# Patient Record
Sex: Female | Born: 1995 | Race: Black or African American | Hispanic: No | Marital: Single | Smoking: Never smoker
Health system: Southern US, Community
[De-identification: ages and names within clinical notes are randomized; demographics above are authoritative.]

## PROBLEM LIST (undated history)

## (undated) DIAGNOSIS — N946 Dysmenorrhea, unspecified: Secondary | ICD-10-CM

## (undated) DIAGNOSIS — Z3189 Encounter for other procreative management: Secondary | ICD-10-CM

## (undated) DIAGNOSIS — J45909 Unspecified asthma, uncomplicated: Secondary | ICD-10-CM

## (undated) DIAGNOSIS — J189 Pneumonia, unspecified organism: Secondary | ICD-10-CM

---

## 2017-06-23 ENCOUNTER — Emergency Department (HOSPITAL_COMMUNITY)
Admission: EM | Admit: 2017-06-23 | Discharge: 2017-06-23 | Disposition: A | Discharge status: Home / Self Care | Payer: Medicaid - Out of State | Attending: Physician Assistant | Admitting: Physician Assistant

## 2017-06-23 ENCOUNTER — Emergency Department (HOSPITAL_COMMUNITY): Payer: Medicaid - Out of State

## 2017-06-23 ENCOUNTER — Encounter (HOSPITAL_COMMUNITY): Payer: Self-pay | Admitting: Emergency Medicine

## 2017-06-23 DIAGNOSIS — Z77120 Contact with and (suspected) exposure to mold (toxic): Secondary | ICD-10-CM

## 2017-06-23 DIAGNOSIS — J029 Acute pharyngitis, unspecified: Secondary | ICD-10-CM | POA: Diagnosis present

## 2017-06-23 DIAGNOSIS — R05 Cough: Secondary | ICD-10-CM | POA: Diagnosis not present

## 2017-06-23 DIAGNOSIS — J069 Acute upper respiratory infection, unspecified: Secondary | ICD-10-CM | POA: Diagnosis not present

## 2017-06-23 DIAGNOSIS — B9789 Other viral agents as the cause of diseases classified elsewhere: Secondary | ICD-10-CM

## 2017-06-23 HISTORY — DX: Pneumonia, unspecified organism: J18.9

## 2017-06-23 LAB — RAPID STREP SCREEN (MED CTR MEBANE ONLY): STREPTOCOCCUS, GROUP A SCREEN (DIRECT): NEGATIVE

## 2017-06-23 MED ORDER — CETIRIZINE-PSEUDOEPHEDRINE ER 5-120 MG PO TB12: 1.0000 | ORAL_TABLET | Freq: Every day | ORAL | 0 refills | Status: AC

## 2017-06-23 MED ORDER — IBUPROFEN 600 MG PO TABS: 600.0000 mg | ORAL_TABLET | Freq: Four times a day (QID) | ORAL | 0 refills | Status: AC | PRN

## 2017-06-23 NOTE — ED Notes (Signed)
Pt taken to Xray.

## 2017-06-23 NOTE — Discharge Instructions (Addendum)
Please read and follow all provided instructions.  Your diagnoses today include:  1. Viral pharyngitis     Tests performed today include: Vital signs. See below for your results today.   Medications prescribed:  Take as prescribed   Home care instructions:  Follow any educational materials contained in this packet.  Follow-up instructions: Please follow-up with your primary care provider for further evaluation of symptoms and treatment   Return instructions:  Please return to the Emergency Department if you do not get better, if you get worse, or new symptoms OR  - Fever (temperature greater than 101.68F)  - Bleeding that does not stop with holding pressure to the area    -Severe pain (please note that you may be more sore the day after your accident)  - Chest Pain  - Difficulty breathing  - Severe nausea or vomiting  - Inability to tolerate food and liquids  - Passing out  - Skin becoming red around your wounds  - Change in mental status (confusion or lethargy)  - New numbness or weakness    Please return if you have any other emergent concerns.  Additional Information:  Your vital signs today were: BP 110/72 (BP Location: Right Arm)    Pulse 96    Temp 98.4 F (36.9 C) (Oral)    Resp 16    Ht  (1.803 m)    Wt 74.8 kg (165 lb)    LMP 06/03/2017    SpO2 100%    BMI 23.01 kg/m  If your blood pressure (BP) was elevated above 135/85 this visit, please have this repeated by your doctor within one month. ---------------

## 2017-06-23 NOTE — ED Provider Notes (Signed)
[facility]-EMERGENCY DEPT Provider Note   CSN: [id number] Arrival date & time: 06/23/17  1625     History   Chief Complaint Chief Complaint  Patient presents with  . Sore Throat    HPI Maria Rubio is a 21 y.o. female.  HPI  21 y.o. female  presents to the Emergency Department today due to sore throat and nasal congestion x several days. 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 States residents are moving out because of the mold. Pt worried because her symptoms feels like pneumonia in the past. No N/V/D. Notes shortness of breath intermittently. No CP. Notes rhinorrhea, congestion of sinuses, sore throat. No fevers. No meds PTA. No other symptoms noted.     Past Medical History:  Diagnosis Date  . Pneumonia     There are no active problems to display for this patient.   History reviewed. No pertinent surgical history.  OB History    No data available       Home Medications    Prior to Admission medications   Not on File    Family History No family history on file.  Social History Social History  Substance Use Topics  . Smoking status: Never Smoker  . Smokeless tobacco: Never Used  . Alcohol use No     Allergies   Dilaudid [hydromorphone hcl] and Fentanyl   Review of Systems Review of Systems  Constitutional: Negative for fever.  HENT: Positive for congestion and sore throat. Negative for trouble swallowing and voice change.   Respiratory: Negative for cough and shortness of breath.   Cardiovascular: Negative for chest pain.  Gastrointestinal: Negative for nausea and vomiting.     Physical Exam Updated Vital Signs BP 110/72 (BP Location: Right Arm)   Pulse 96   Temp 98.4 F (36.9 C) (Oral)   Resp 16   Ht  (1.803 m)   Wt 74.8 kg (165 lb)   LMP 06/03/2017   SpO2 100%   BMI 23.01 kg/m   Physical Exam  Constitutional: She is oriented to person, place, and time. She appears well-developed and  well-nourished. No distress.  HENT:  Head: Normocephalic and atraumatic.  Right Ear: Tympanic membrane, external ear and ear canal normal.  Left Ear: Tympanic membrane, external ear and ear canal normal.  Nose: Nose normal.  Mouth/Throat: Uvula is midline, oropharynx is clear and moist and mucous membranes are normal. No trismus in the jaw. No oropharyngeal exudate, posterior oropharyngeal erythema or tonsillar abscesses.  Eyes: Pupils are equal, round, and reactive to light. EOM are normal.  Neck: Normal range of motion. Neck supple. No tracheal deviation present.  Cardiovascular: Normal rate, regular rhythm, S1 normal, S2 normal, normal heart sounds, intact distal pulses and normal pulses.   Pulmonary/Chest: Effort normal and breath sounds normal. No respiratory distress. She has no decreased breath sounds. She has no wheezes. She has no rhonchi. She has no rales.  Abdominal: Normal appearance and bowel sounds are normal. There is no tenderness.  Musculoskeletal: Normal range of motion.  Neurological: She is alert and oriented to person, place, and time.  Skin: Skin is warm and dry.  Psychiatric: She has a normal mood and affect. Her speech is normal and behavior is normal. Thought content normal.     ED Treatments / Results  Labs (all labs ordered are listed, but only abnormal results are displayed) Labs Reviewed  RAPID STREP SCREEN (NOT AT [facility])  CULTURE,  GROUP [facility]    EKG  EKG Interpretation None       Radiology Dg Chest 2 View  Result Date: 06/23/2017 CLINICAL DATA:  Sore throat and nasal congestion. EXAM: CHEST  2 VIEW COMPARISON:  None. FINDINGS: The heart size is normal. The lungs are clear. The visualized soft tissues are unremarkable. Dextroconvex scoliosis of the thoracic spine measures 24 degrees from the superior endplate of T7 through the superior endplate of L1. IMPRESSION: 1. No acute cardiopulmonary disease. 2. Scoliosis. Electronically Signed   By:  Marin Roberts M.D.   On: 06/23/2017 18:40    Procedures Procedures (including critical care time)  Medications Ordered in ED Medications - No data to display   Initial Impression / Assessment and Plan / ED Course  I have reviewed the triage vital signs and the nursing notes.  Pertinent labs & imaging results that were available during my care of the patient were reviewed by me and considered in my medical decision making (see chart for details).  Final Clinical Impressions(s) / ED Diagnoses  {I have reviewed and evaluated the relevant laboratory values.   {I have reviewed the relevant previous healthcare records.  {I obtained HPI from historian.   ED Course:  Assessment: Pt is a 21 y.o. female presents to the Emergency Department today due to sore throat and nasal congestion x several days. 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 States residents are moving out because of the mold. Pt worried because her symptoms feels like pneumonia in the past. No N/V/D. Notes shortness of breath intermittently. No CP. Notes rhinorrhea, congestion of sinuses, sore throat. No fevers. No meds PTA. On exam, pt in NAD. Nontoxic/nonseptic appearing. VSS. Afebrile. Lungs CTA. Heart RRR. Abdomen nontender soft. Strep negative. CXR unremarkable. Plan is to DC home with follow up to PCP. Encouraged leaving dorm with mold.. At time of discharge, Patient is in no acute distress. Vital Signs are stable. Patient is able to ambulate. Patient able to tolerate PO.   Disposition/Plan:  DC Home Additional Verbal discharge instructions given and discussed with patient.  Pt Instructed to f/u with PCP in the next week for evaluation and treatment of symptoms. Return precautions given Pt acknowledges and agrees with plan  Supervising Physician Mackuen, Courteney Lyn, *  Final diagnoses:  Viral URI with cough    New Prescriptions New Prescriptions   No medications on  file     Audry Pili, Cordelia Poche 06/23/17 1901    Abelino Derrick, MD 06/24/17 1458

## 2017-06-23 NOTE — ED Triage Notes (Signed)
Pt c/o sore throat and nasal congestion. 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

## 2017-06-23 NOTE — ED Notes (Signed)
Pt returned from X-ray.  

## 2017-06-26 LAB — CULTURE, GROUP A STREP (THRC)

## 2018-11-27 ENCOUNTER — Emergency Department (HOSPITAL_COMMUNITY)
Admission: EM | Admit: 2018-11-27 | Discharge: 2018-11-27 | Disposition: A | Discharge status: Home / Self Care | Payer: BLUE CROSS/BLUE SHIELD | Attending: Emergency Medicine | Admitting: Emergency Medicine

## 2018-11-27 ENCOUNTER — Encounter (HOSPITAL_COMMUNITY): Payer: Self-pay

## 2018-11-27 ENCOUNTER — Other Ambulatory Visit: Payer: Self-pay

## 2018-11-27 DIAGNOSIS — Z5321 Procedure and treatment not carried out due to patient leaving prior to being seen by health care provider: Secondary | ICD-10-CM | POA: Insufficient documentation

## 2018-11-27 DIAGNOSIS — R079 Chest pain, unspecified: Secondary | ICD-10-CM | POA: Insufficient documentation

## 2018-11-27 HISTORY — DX: Unspecified asthma, uncomplicated: J45.909

## 2018-11-27 LAB — BASIC METABOLIC PANEL
ANION GAP: 9 (ref 5–15)
BUN: 10 mg/dL (ref 6–20)
CALCIUM: 9.2 mg/dL (ref 8.9–10.3)
CO2: 24 mmol/L (ref 22–32)
Chloride: 104 mmol/L (ref 98–111)
Creatinine, Ser: 0.95 mg/dL (ref 0.44–1.00)
GFR calc non Af Amer: >60 mL/min (ref >60)
Glucose, Bld: 85 mg/dL (ref 70–99)
Potassium: 3.9 mmol/L (ref 3.5–5.1)
SODIUM: 137 mmol/L (ref 135–145)

## 2018-11-27 LAB — I-STAT TROPONIN, ED: Troponin i, poc: 0 ng/mL (ref 0.00–0.08)

## 2018-11-27 LAB — CBC
HEMATOCRIT: 40.6 % (ref 36.0–46.0)
Hemoglobin: 12.8 g/dL (ref 12.0–15.0)
MCH: 27.7 pg (ref 26.0–34.0)
MCHC: 31.5 g/dL (ref 30.0–36.0)
MCV: 87.9 fL (ref 80.0–100.0)
NRBC: 0 % (ref 0.0–0.2)
PLATELETS: 302 10*3/uL (ref 150–400)
RBC: 4.62 MIL/uL (ref 3.87–5.11)
RDW: 13.1 % (ref 11.5–15.5)
WBC: 5.9 10*3/uL (ref 4.0–10.5)

## 2018-11-27 LAB — I-STAT BETA HCG BLOOD, ED (MC, WL, AP ONLY)

## 2018-11-27 MED ORDER — SODIUM CHLORIDE 0.9% FLUSH: 3.0000 mL | Freq: Once | INTRAVENOUS | Status: DC

## 2018-11-27 NOTE — ED Notes (Signed)
Pt. At desk wondering her wait time and asking if she will get a Coronavirus test. This tech explained procedures to pt and made no promises, explained that she would need to be seen by a doctor before any exam and test would be done. Pt. Stating she doesn't want to wait  Another 3 hours for nothing to be done so she would be leaving. Pulling pt. OTF.

## 2018-11-27 NOTE — ED Triage Notes (Signed)
Pt reports recent travel from [place] and now having chest pain, sore throat, intermittent cough and fever as high as 99.4. pt was seen at her student clinis earlier today and they said she was negative for the flu and strept throat but would like to be checked for the coronavirus. Pt a.o, nad at this time.

## 2019-05-13 IMAGING — DX DG CHEST 2V
2 series · 2 of 2 positions shown · non-contrast
Comparison: None.

CLINICAL DATA: Sore throat and nasal congestion.

EXAM:
CHEST  2 VIEW

[chest pa]
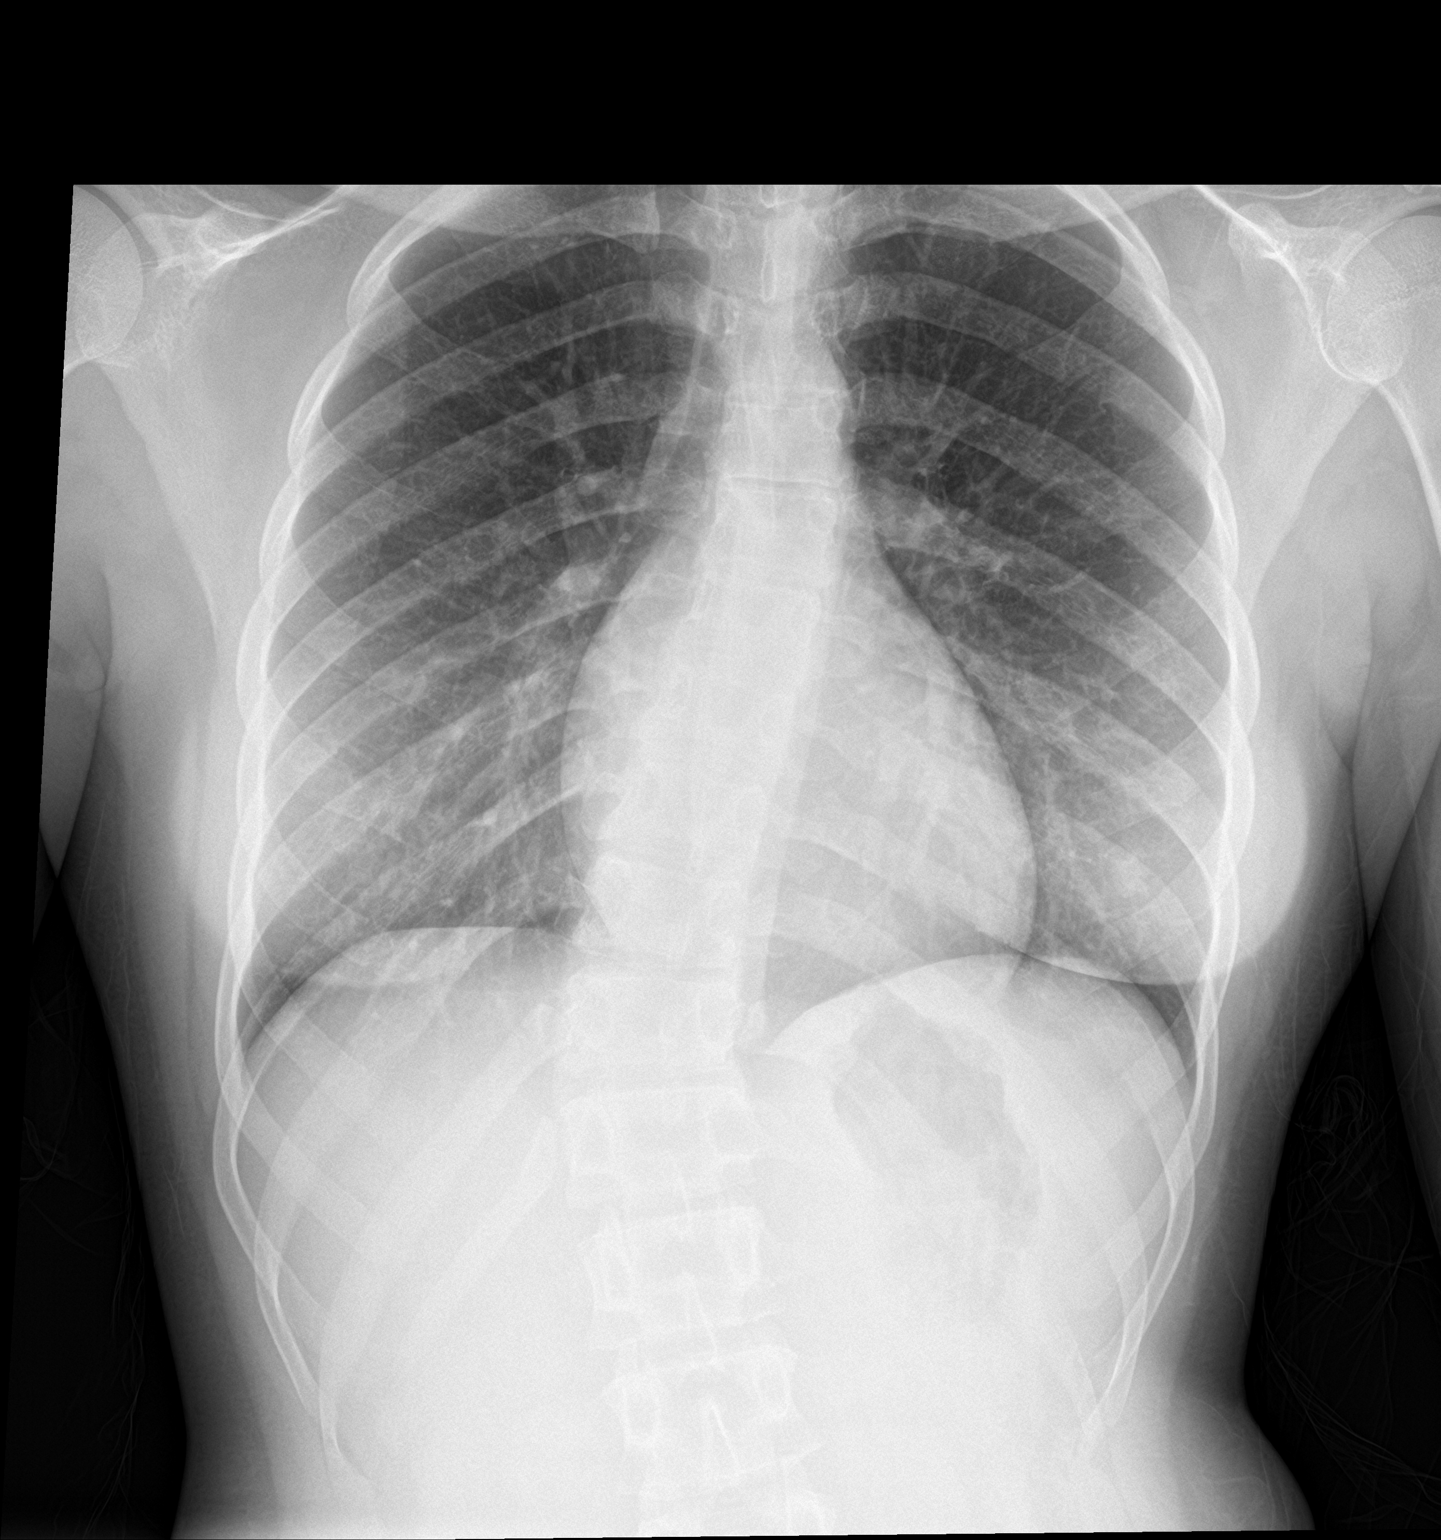

[chest lat]
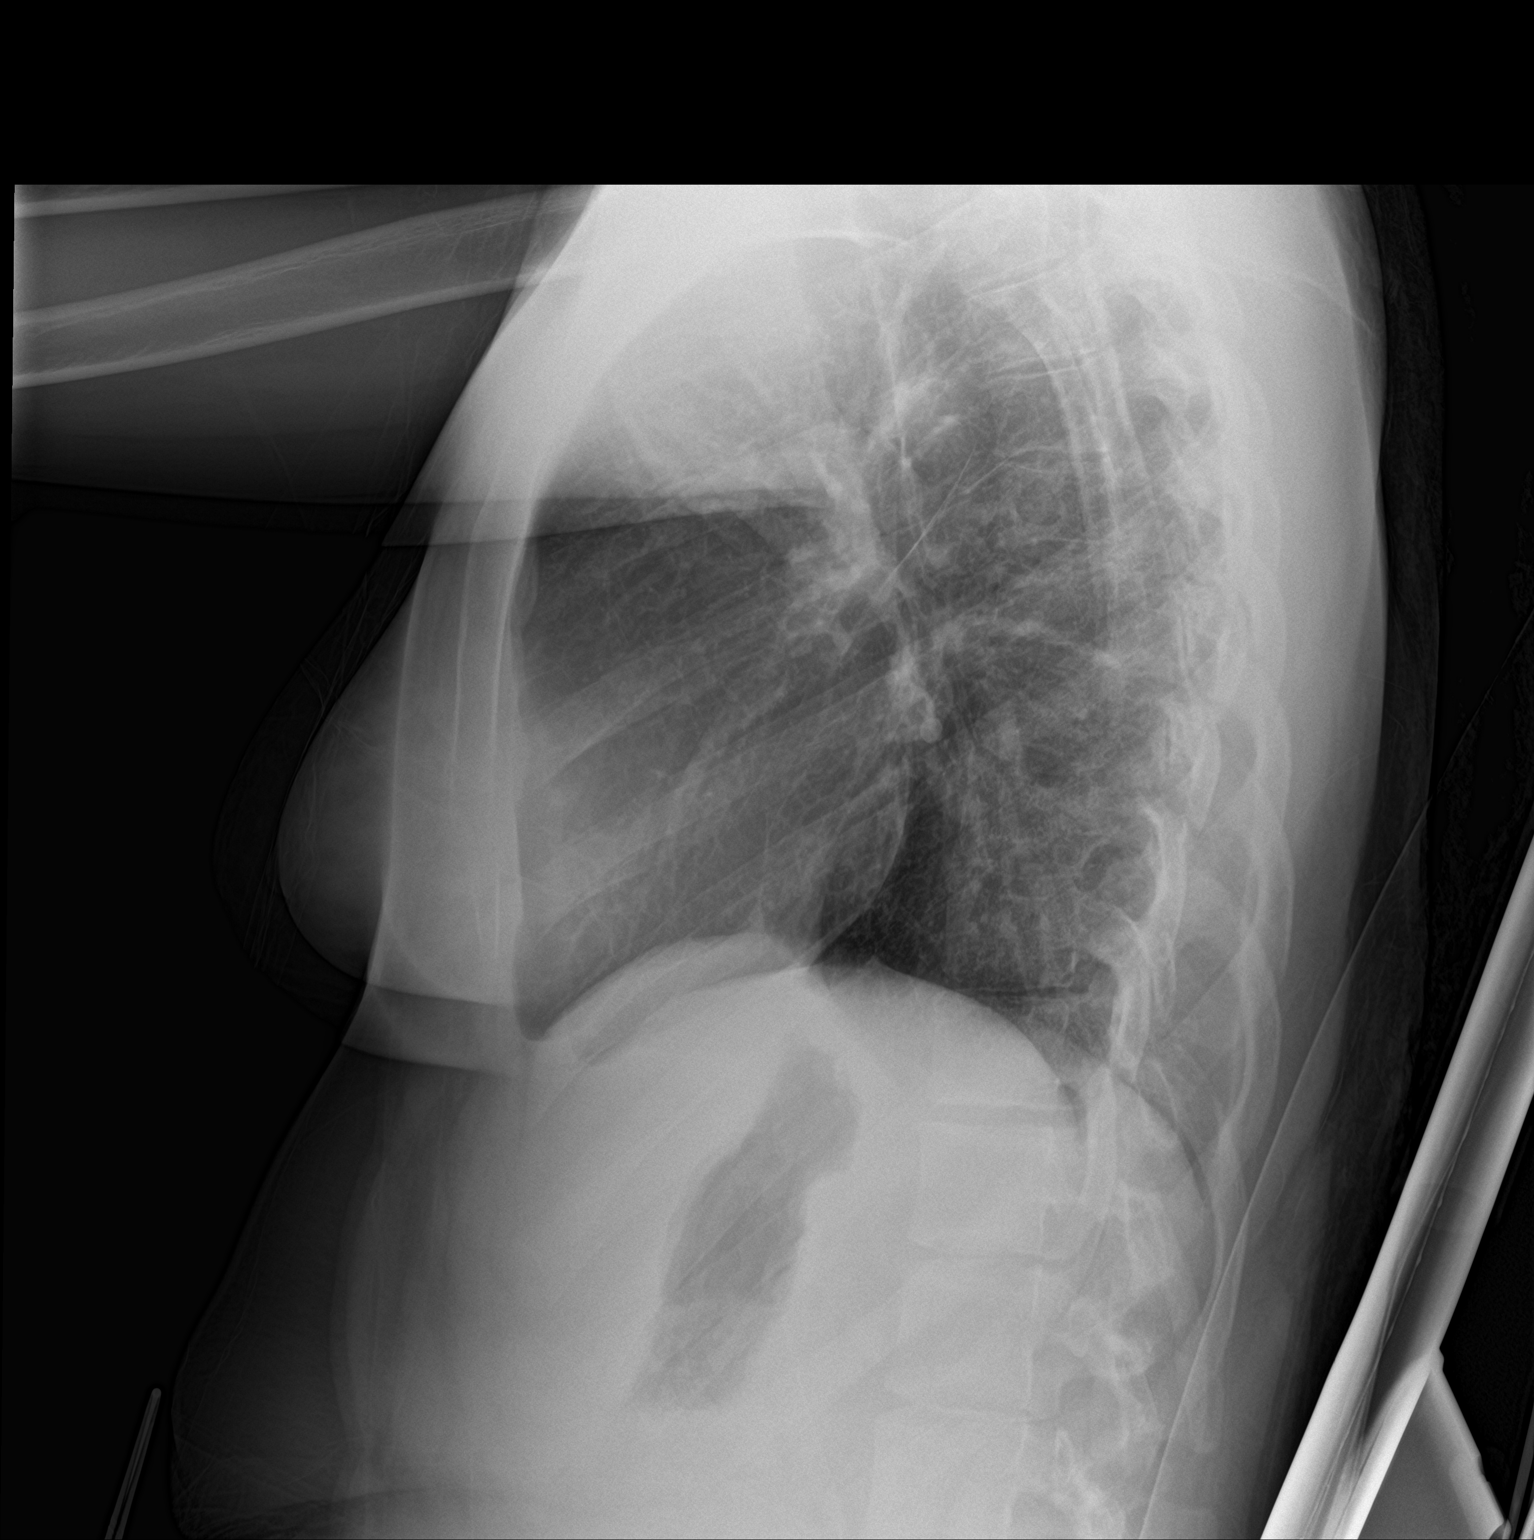

[2 of 2 positions shown; findings below may reference images not displayed]

FINDINGS: The heart size is normal. The lungs are clear. The visualized soft
tissues are unremarkable. Dextroconvex scoliosis of the thoracic
spine measures 24 degrees from the superior endplate of T7 through
the superior endplate of L1.
IMPRESSION: 1. No acute cardiopulmonary disease.
2. Scoliosis.

## 2019-08-12 ENCOUNTER — Other Ambulatory Visit: Payer: Self-pay

## 2019-08-12 ENCOUNTER — Emergency Department (HOSPITAL_COMMUNITY)
Admission: EM | Admit: 2019-08-12 | Discharge: 2019-08-13 | Disposition: A | Discharge status: Home / Self Care | Payer: BC Managed Care – PPO | Attending: Emergency Medicine | Admitting: Emergency Medicine

## 2019-08-12 ENCOUNTER — Encounter (HOSPITAL_COMMUNITY): Payer: Self-pay | Admitting: Emergency Medicine

## 2019-08-12 DIAGNOSIS — R509 Fever, unspecified: Secondary | ICD-10-CM

## 2019-08-12 DIAGNOSIS — J038 Acute tonsillitis due to other specified organisms: Secondary | ICD-10-CM | POA: Diagnosis not present

## 2019-08-12 DIAGNOSIS — B9789 Other viral agents as the cause of diseases classified elsewhere: Secondary | ICD-10-CM | POA: Insufficient documentation

## 2019-08-12 DIAGNOSIS — Z20828 Contact with and (suspected) exposure to other viral communicable diseases: Secondary | ICD-10-CM | POA: Diagnosis not present

## 2019-08-12 DIAGNOSIS — J029 Acute pharyngitis, unspecified: Secondary | ICD-10-CM | POA: Diagnosis present

## 2019-08-12 LAB — GROUP A STREP BY PCR: Group A Strep by PCR: NOT DETECTED

## 2019-08-12 NOTE — ED Triage Notes (Signed)
Pt c/o sore throat that started today and a fever. States she took 600mg  ibuprofen PTA.

## 2019-08-13 MED ORDER — ALUM & MAG HYDROXIDE-SIMETH 200-200-20 MG/5ML PO SUSP
30.0000 mL | Freq: Once | ORAL | Status: AC
  Administered 2019-08-13: 30 mL via ORAL
  Filled 2019-08-13: qty 30

## 2019-08-13 MED ORDER — DEXAMETHASONE SODIUM PHOSPHATE 10 MG/ML IJ SOLN
10.0000 mg | Freq: Once | INTRAMUSCULAR | Status: AC
  Administered 2019-08-13: 10 mg via INTRAMUSCULAR
  Filled 2019-08-13: qty 1

## 2019-08-13 MED ORDER — LIDOCAINE VISCOUS HCL 2 % MT SOLN
15.0000 mL | Freq: Once | OROMUCOSAL | Status: AC
  Administered 2019-08-13: 15 mL via ORAL
  Filled 2019-08-13: qty 15

## 2019-08-13 MED ORDER — MAGIC MOUTHWASH W/LIDOCAINE: 5.0000 mL | Freq: Four times a day (QID) | ORAL | 0 refills | Status: DC | PRN

## 2019-08-13 MED ORDER — MAGIC MOUTHWASH W/LIDOCAINE: 5.0000 mL | Freq: Four times a day (QID) | ORAL | 0 refills | Status: AC | PRN

## 2019-08-13 MED ORDER — IBUPROFEN 800 MG PO TABS
800.0000 mg | ORAL_TABLET | Freq: Once | ORAL | Status: AC
  Administered 2019-08-13: 800 mg via ORAL
  Filled 2019-08-13: qty 1

## 2019-08-13 NOTE — ED Provider Notes (Signed)
[facility] EMERGENCY DEPARTMENT Provider Note   CSN: [id number] Arrival date & time: 08/12/19  2254     History   Chief Complaint Chief Complaint  Patient presents with  . Sore Throat    HPI Maria Rubio is a 23 y.o. female.     23 year old female presents to the emergency department for evaluation of sore throat which began on Monday, August 12, 2019.  Her sore throat has been constant and unchanged.  Not relieved with 600 mg ibuprofen.  This was last taken at 1500.  She has odynophagia, but no inability to swallow, drooling.  Noted to be febrile in triage.  Denies congestion, shortness of breath, cough, vomiting.  No known sick contacts.  Did have a negative outpatient Covid test 1 week ago.  The history is provided by the patient. No language interpreter was used.  Sore Throat    Past Medical History:  Diagnosis Date  . Asthma   . Pneumonia     There are no active problems to display for this patient.   History reviewed. No pertinent surgical history.   OB History   No obstetric history on file.      Home Medications    Prior to Admission medications   Medication Sig Start Date End Date Taking? Authorizing Provider  cetirizine-pseudoephedrine (ZYRTEC-D) 5-120 MG tablet Take 1 tablet by mouth daily. 06/23/17   Audry Pili, PA-C  ibuprofen (ADVIL,MOTRIN) 600 MG tablet Take 1 tablet (600 mg total) by mouth every 6 (six) hours as needed. 06/23/17   Audry Pili, PA-C    Family History No family history on file.  Social History Social History   Tobacco Use  . Smoking status: Never Smoker  . Smokeless tobacco: Never Used  Substance Use Topics  . Alcohol use: No  . Drug use: No     Allergies   Dilaudid [hydromorphone hcl], Fentanyl, Aspirin, and Tylenol [acetaminophen]   Review of Systems Review of Systems Ten systems reviewed and are negative for acute change, except as noted in the HPI.    Physical Exam Updated Vital Signs  BP 112/68 (BP Location: Right Arm)   Pulse (!) 101   Temp (!) 101.7 F (38.7 C)   Resp 16   SpO2 100%   Physical Exam Vitals signs and nursing note reviewed.  Constitutional:      General: She is not in acute distress.    Appearance: She is well-developed. She is not diaphoretic.     Comments: Nontoxic appearing and in NAD  HENT:     Head: Normocephalic and atraumatic.     Mouth/Throat:     Mouth: Mucous membranes are moist.     Pharynx: Posterior oropharyngeal erythema (mild, posterior oropharynx) present. No uvula swelling.     Tonsils: Tonsillar exudate (scant bilaterally) present. No tonsillar abscesses.     Comments: Slight erythema to bilateral tonsils, R>L with mild tonsillar inflammation. Normal phonation. No trismus or stridor. Tolerating secretions without difficulty or drooling. Eyes:     General: No scleral icterus.    Conjunctiva/sclera: Conjunctivae normal.  Neck:     Musculoskeletal: Normal range of motion.     Comments: No meningismus Pulmonary:     Effort: Pulmonary effort is normal. No respiratory distress.     Comments: Respirations even and unlabored Musculoskeletal: Normal range of motion.  Skin:    General: Skin is warm and dry.     Coloration: Skin is not pale.     Findings: No erythema  or rash.  Neurological:     Mental Status: She is alert and oriented to person, place, and time.     Coordination: Coordination normal.  Psychiatric:        Behavior: Behavior normal.      ED Treatments / Results  Labs (all labs ordered are listed, but only abnormal results are displayed) Labs Reviewed  GROUP A STREP BY PCR  NOVEL CORONAVIRUS, NAA (HOSP ORDER, SEND-OUT TO REF LAB; TAT 18-24 HRS)    EKG None  Radiology No results found.  Procedures Procedures (including critical care time)  Medications Ordered in ED Medications  ibuprofen (ADVIL) tablet 800 mg (has no administration in time range)  alum & mag hydroxide-simeth (MAALOX/MYLANTA)  200-200-20 MG/5ML suspension 30 mL (has no administration in time range)    And  lidocaine (XYLOCAINE) 2 % viscous mouth solution 15 mL (has no administration in time range)  dexamethasone (DECADRON) injection 10 mg (has no administration in time range)     Initial Impression / Assessment and Plan / ED Course  I have reviewed the triage vital signs and the nursing notes.  Pertinent labs & imaging results that were available during my care of the patient were reviewed by me and considered in my medical decision making (see chart for details).        23 year old female presenting for odynophagia with evidence of mild tonsillitis.  There is posterior oropharyngeal erythema, worse on the right compared to left.  Her uvula is midline and not edematous.  There is no current concern for peritonsillar abscess or infection spread to soft tissue.  Febrile on arrival, but nontoxic appearing.  Does not appear clinically dehydrated.  She is tolerating her secretions without difficulty.  Normal phonation without voice muffling.  Strep screen negative.  Outpatient COVID sent for screening.  Symptoms may be due to other viral pharyngitis.  Treated in the ED with ibuprofen, GI cocktail, Decadron.  Discharged with supportive care instructions.  Return precautions discussed and provided. Patient discharged in stable condition with no unaddressed concerns.   Final Clinical Impressions(s) / ED Diagnoses   Final diagnoses:  Acute viral tonsillitis  Febrile illness    ED Discharge Orders    None       Antonietta Breach, PA-C 08/13/19 0147    Orpah Greek, MD 08/13/19 0201

## 2019-08-13 NOTE — Discharge Instructions (Signed)
Take 600 mg ibuprofen every 6 hours in addition to 1000 mg Tylenol every 8 hours for management of pain.  You may use over-the-counter Chloraseptic spray for management of ongoing throat discomfort.  Drink plenty of clear liquids to prevent dehydration, especially warm liquids such as tea with honey and lemon as this can be soothing for your throat.  Perform salt water gargles 2-3 times per day.  Your strep screen is negative. You have a Covid test pending which should result in 1 to 2 days.  You should receive a call if this test is positive, but can also access results using MyChart.  We recommend that you quarantine until you receive the results of this test.  Return for new or concerning symptoms such as a fever that does not respond to Tylenol or Motrin, inability to swallow/drooling, shortness of breath/difficulty breathing, muffling of your voice.

## 2019-08-13 NOTE — ED Notes (Signed)
Maria Rubio [phone] mother is asking for an update on the patient

## 2019-08-14 LAB — NOVEL CORONAVIRUS, NAA (HOSP ORDER, SEND-OUT TO REF LAB; TAT 18-24 HRS): SARS-CoV-2, NAA: NOT DETECTED

## 2019-12-19 ENCOUNTER — Ambulatory Visit: Payer: BC Managed Care – PPO | Attending: Family

## 2019-12-19 DIAGNOSIS — Z23 Encounter for immunization: Secondary | ICD-10-CM

## 2019-12-19 NOTE — Progress Notes (Signed)
   Covid-19 Vaccination Clinic  Name:  Maria Rubio    MRN: [id number] DOB: 19-May-1996  12/19/2019  Maria Rubio was observed post Covid-19 immunization for 15 minutes without incident. She was provided with Vaccine Information Sheet and instruction to access the V-Safe system.   Maria Rubio was instructed to call 911 with any severe reactions post vaccine: Marland Kitchen Difficulty breathing  . Swelling of face and throat  . A fast heartbeat  . A bad rash all over body  . Dizziness and weakness   Immunizations Administered    Name Date Dose VIS Date Route   Moderna COVID-19 Vaccine 12/19/2019  3:48 PM 0.5 mL 08/20/2019 Intramuscular   Manufacturer: Moderna   Lot: [id number]   NDC: 01720-910-68

## 2020-01-14 ENCOUNTER — Ambulatory Visit: Payer: BC Managed Care – PPO

## 2020-01-14 ENCOUNTER — Ambulatory Visit: Payer: BC Managed Care – PPO | Attending: Family

## 2020-01-14 DIAGNOSIS — Z23 Encounter for immunization: Secondary | ICD-10-CM

## 2020-01-14 NOTE — Progress Notes (Signed)
   Covid-19 Vaccination Clinic  Name:  Maria Rubio    MRN: [id number] DOB: 07-20-1996  01/14/2020  [name] was observed post Covid-19 immunization for 30 minutes based on pre-vaccination screening without incident. She was provided with Vaccine Information Sheet and instruction to access the V-Safe system.   [name] was instructed to call 911 with any severe reactions post vaccine: Marland Kitchen Difficulty breathing  . Swelling of face and throat  . A fast heartbeat  . A bad rash all over body  . Dizziness and weakness   Immunizations Administered    Name Date Dose VIS Date Route   Moderna COVID-19 Vaccine 01/14/2020  2:42 PM 0.5 mL 08/2019 Intramuscular   Manufacturer: Moderna   Lot: [id number]   NDC: 15996-895-70

## 2020-01-21 ENCOUNTER — Ambulatory Visit: Payer: BC Managed Care – PPO

## 2022-05-19 ENCOUNTER — Inpatient Hospital Stay: Admit: 2022-05-20 | Payer: BLUE CROSS/BLUE SHIELD

## 2022-05-19 ENCOUNTER — Ambulatory Visit: Admit: 2022-05-19 | Payer: BLUE CROSS/BLUE SHIELD | Attending: Obstetrics & Gynecology

## 2022-05-19 ENCOUNTER — Encounter

## 2022-05-19 DIAGNOSIS — Z01419 Encounter for gynecological examination (general) (routine) without abnormal findings: Secondary | ICD-10-CM

## 2022-05-19 MED ORDER — ETONOGESTREL-ETHINYL ESTRADIOL 0.12-0.015 MG/24HR VA RING
VAGINAL | 5 refills | Status: AC
Start: 2022-05-19 — End: 2023-03-07

## 2022-05-19 NOTE — Progress Notes (Signed)
Annual Well Women Exam    Christy Bailey is a 26 y.o. presenting for annual exam. Her main concerns today include future fertility and discussing history of recurrent STD's.     She declines a chaperone during the gynecologic exam today.     Ob/Gyn Hx:  G0   LMP - skips menses  Menses - skips menses  Contraception - Nuvaring  STI - Chlamydia, Gonorrhea  SA - Yes, female partner    Health maintenance:  Pap - May 2021  Gardasil -uncertain     Past Medical History:   Diagnosis Date    STI (sexually transmitted infection)     history of gc/c       Past Surgical History:   Procedure Laterality Date    KNEE ARTHROSCOPY Left        No family history on file.    Social History     Socioeconomic History    Marital status: Single     Spouse name: Not on file    Number of children: Not on file    Years of education: Not on file    Highest education level: Not on file   Occupational History    Not on file   Tobacco Use    Smoking status: Not on file    Smokeless tobacco: Not on file   Substance and Sexual Activity    Alcohol use: Not on file    Drug use: Not on file    Sexual activity: Not on file   Other Topics Concern    Not on file   Social History Narrative    Not on file     Social Determinants of Health     Financial Resource Strain: Not on file   Food Insecurity: Not on file   Transportation Needs: Not on file   Physical Activity: Not on file   Stress: Not on file   Social Connections: Not on file   Intimate Partner Violence: Not on file   Housing Stability: Not on file       No current outpatient medications on file.     No current facility-administered medications for this visit.       Allergies   Allergen Reactions    Aspirin Anaphylaxis    Fentanyl Anaphylaxis, Other (See Comments) and Swelling     Had reaction to anesthia  Other reaction(s): Angioedema      Hydromorphone Anaphylaxis and Other (See Comments)     Reaction to anesthsia      Hydromorphone Hcl Anaphylaxis    Other Anaphylaxis and Swelling     Other  reaction(s): Hives, Shortness of Breath  Other reaction(s): Rash  All nuts except hazel nuts and almonds  Other reaction(s): Angioedema    Acetaminophen      Unsure reaction, prefers not to take    Ketorolac     Peanut (Diagnostic) Hives     Cashews, pistachio, walnuts, pecans         Review of Systems - History obtained from the patient  Constitutional: negative for weight loss, fever, night sweats  HEENT: negative for hearing loss, earache, congestion, snoring, sorethroat  CV: negative for chest pain, palpitations, edema  Resp: negative for cough, shortness of breath, wheezing  GI: negative for change in bowel habits, abdominal pain, black or bloody stools  GU: negative for frequency, dysuria, hematuria, vaginal discharge  MSK: negative for back pain, joint pain, muscle pain  Breast: negative for breast lumps, nipple discharge, galactorrhea  Skin :negative for itching, rash, hives  Neuro: negative for dizziness, headache, confusion, weakness  Psych: negative for anxiety, depression, change in mood  Heme/lymph: negative for bleeding, bruising, pallor    Physical Exam    BP 130/82 (Site: Right Upper Arm, Position: Sitting)   Wt 183 lb (83 kg)     Constitutional  Appearance: well-nourished, well developed, alert, in no acute distress    HENT  Head and Face: appears normal    Neck  Inspection/Palpation: normal appearance, no masses or tenderness  Thyroid: gland size normal, nontender, no nodules or masses present on palpation    Chest  Respiratory Effort: non-labored breathing  Auscultation: CTAB, normal breath sounds    Cardiovascular  Heart:  Auscultation: regular rate and rhythm without murmur  Extremities: no peripheral edema    Breasts  Inspection of Breasts: breasts symmetrical, no skin changes, no discharge present, nipple appearance normal, no skin retraction present  Palpation of Breasts and Axillae: no masses present on palpation, no breast tenderness  Axillary Lymph Nodes: no lymphadenopathy  present    Gastrointestinal  Abdominal Examination: abdomen non-tender to palpation, normal bowel sounds, no masses present  Liver and spleen: no hepatomegaly present, spleen not palpable  Hernias: no hernias identified    Genitourinary  External Genitalia: normal appearance for age, no discharge present, no tenderness present, no inflammatory lesions present, no masses present, no atrophy present  Vagina: normal vaginal vault without central or paravaginal defects, no discharge present, no inflammatory lesions present, no masses present  Bladder: non-tender to palpation  Urethra: appears normal  Cervix: normal   Uterus: normal size, shape and consistency  Adnexa: no adnexal tenderness present, no adnexal masses present  Perineum: perineum within normal limits, no evidence of trauma, no rashes or skin lesions present    Skin  General Inspection: no rash, no lesions identified    Neurologic/Psychiatric  Mental Status:  Orientation: grossly oriented to person, place and time  Mood and Affect: mood normal, affect appropriate      Assessment/Plan:  Christy Bailey is a 26 y.o. presenting for annual exam. Overall doing well.     1. Women's annual routine gynecological examination  - PAP IG, CT-NG-TV, rfx Aptima HPV ASCUS ([id number])    2. 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 Dysmenorrhea   -despite nuvaring   -mother with endometriosis   -will obtain US for further evaluation     Well woman exam:  Normal gynecologic and breast exams.   Healthy habits and lifestyle reviewed.   Pap with HPV reflex performed today.  Patient desires STD screening.  Contraception and menstrual regulation - patient opts for nuvaring       Patient will follow up PRN or next annual exam.      Joselyn Edling L. Lambert Mody, MD

## 2022-05-25 LAB — CHLAMYDIA, GONORRHEA, TRICHOMONIASIS
Chlamydia trachomatis, NAA: NEGATIVE
Neisseria Gonorrhoeae, NAA: NEGATIVE
Trichomonas Vaginalis by NAA: NEGATIVE

## 2022-07-19 NOTE — Telephone Encounter (Signed)
Called to reschedule appts due to [facility] surgery schedule and patient stated she did not have her calendar and wanted to move appointment further out.

## 2022-07-29 ENCOUNTER — Encounter: Payer: BLUE CROSS/BLUE SHIELD | Attending: Obstetrics & Gynecology

## 2022-07-29 ENCOUNTER — Ambulatory Visit: Payer: BLUE CROSS/BLUE SHIELD

## 2023-01-30 ENCOUNTER — Ambulatory Visit: Admit: 2023-01-30 | Discharge: 2023-01-30 | Payer: BLUE CROSS/BLUE SHIELD | Attending: Obstetrics & Gynecology

## 2023-01-30 DIAGNOSIS — R635 Abnormal weight gain: Secondary | ICD-10-CM

## 2023-01-30 NOTE — Progress Notes (Addendum)
Problem Visit    Christy Bailey is a 27 y.o. presenting for problem visit. Her main concern today is weight gain.   Was on Nuvaring, stopped using in April due to mood - causing depression and weight gain.   Concerns about PCOS. Concerns about fertility in regards to hx of std - hx of chlamydia for 5 months without knowing.     Ob/Gyn Hx:  G0  LMP - No LMP recorded. (Menstrual status: Other - See Notes).  Menses - regular, heavy   Contraception - none.  Hx of STI - Yes - chlamydia 3-4x, gonorrhea 2018  SA - No       Health Maintenance:  Last Pap: 05/19/2022 NILM  Past Medical History:   Diagnosis Date    STI (sexually transmitted infection)     history of gc/c       Past Surgical History:   Procedure Laterality Date    KNEE ARTHROSCOPY Left        History reviewed. No pertinent family history.    Social History     Socioeconomic History    Marital status: Single     Spouse name: Not on file    Number of children: Not on file    Years of education: Not on file    Highest education level: Not on file   Occupational History    Not on file   Tobacco Use    Smoking status: Never    Smokeless tobacco: Never   Vaping Use    Vaping Use: Never used   Substance and Sexual Activity    Alcohol use: Not Currently    Drug use: Never    Sexual activity: Yes     Partners: Male     Birth control/protection: Ring   Other Topics Concern    Not on file   Social History Narrative    Not on file     Social Determinants of Health     Financial Resource Strain: Not on file   Food Insecurity: Not on file   Transportation Needs: Not on file   Physical Activity: Not on file   Stress: Not on file   Social Connections: Not on file   Intimate Partner Violence: Not on file   Housing Stability: Not on file       Current Outpatient Medications   Medication Sig Dispense Refill    Tretinoin, Facial Wrinkles, 0.02 % CREA Apply topically daily      etonogestrel-ethinyl estradiol (NUVARING) 0.12-0.015 MG/24HR vaginal ring Place 1 each vaginally every 30  days Insert one (1) ring vaginally and leave in place for three (3) weeks, then remove for one (1) week. 3 each 5     No current facility-administered medications for this visit.       Allergies   Allergen Reactions    Aspirin Anaphylaxis    Fentanyl Anaphylaxis, Other (See Comments) and Swelling     Had reaction to anesthia  Other reaction(s): Angioedema      Hydromorphone Anaphylaxis and Other (See Comments)     Reaction to anesthsia      Hydromorphone Hcl Anaphylaxis    Other Anaphylaxis and Swelling     Other reaction(s): Hives, Shortness of Breath  Other reaction(s): Rash  All nuts except hazel nuts and almonds  Other reaction(s): Angioedema    Acetaminophen      Unsure reaction, prefers not to take    Ketorolac     Peanut (Diagnostic) Hives  Cashews, pistachio, walnuts, pecans         Review of Systems - History obtained from the patient, Review of System is negative except as otherwise noted in the HPI      Physical Exam    There were no vitals taken for this visit.    OBGyn Exam    Constitutional  Appearance: well-nourished, well developed, alert, in no acute distress    Chest  Respiratory Effort: non-labored breathing    Neurologic/Psychiatric  Mental Status:  Orientation: grossly oriented to person, place and time  Mood and Affect: mood normal, affect appropriate      Assessment/Plan:  Christy Bailey IS A 27 y.o.   1. Weight gain  -worried she may have PCOS. Didn't like nuvaring. Thinks it made her gain weight. 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 Didn't have HSG done. Never followed for [place]. Was skipping cycles with nuvaring. Uncertain what contraception she wants to be on.   - Hemoglobin A1C; Future  - DHEA-Sulfate; Future  - Prolactin; Future  - T4, Free; Future  - TSH; Future  - Testosterone, free, total; Future  - 17-OH Progesterone, LC/MS; Future  - FSH & LH; Future       Patient will return to office   Return for 1-2 week for [place] for dysmenorrhea and ? PCOS.        30+  minutes spent with the patient including face to face interaction and chart review.     Please note that portions of this note was potentially completed with Dragon dictation, the computer voice recognition software.  Quite often unanticipated grammatical, syntax, homophones, and other interpretive errors are inadvertently transcribed by the computer software.  Please disregard these errors.  Please excuse any errors that have escaped final proofreading.  Thank you.        Axel Meas L. Lambert Mody, MD

## 2023-01-30 NOTE — Progress Notes (Signed)
PROBLEM VISIT    Christy Bailey is a 27 y.o.  presenting for a problem visit. Her main concerns today include changing birth control      Chief Complaint   Patient presents with    Contraception   Was on Nuvaring, stopped using in April due to mood - causing depression per pt and weight gain.   Concerns about endometriosis and/or PCOS  Concerns about fertility in regards to hx of std - hx of chlamydia for 5 months without knowing.    Ob/Gyn Hx:  G0  LMP - No LMP recorded. (Menstrual status: Other - See Notes).  Menses - regular, heavy   Contraception - none.  Hx of STI - Yes - chlamydia 3-4x, gonorrhea 2018  SA - No      Health Maintenance:  Last Pap: 05/19/2022 NILM    1. Have you been to the ER, urgent care clinic, or hospitalized since your last visit?No    2. Have you seen or consulted any other health care providers outside of the [facility] since your last visit? No    Patient declines chaperone.    Dorothyann Gibbs, RN

## 2023-03-01 ENCOUNTER — Ambulatory Visit: Admit: 2023-03-01 | Payer: BLUE CROSS/BLUE SHIELD

## 2023-03-01 DIAGNOSIS — E282 Polycystic ovarian syndrome: Secondary | ICD-10-CM

## 2023-03-01 LAB — FSH & LH
FSH: 2.7 m[IU]/mL
LH: 9.4 m[IU]/mL

## 2023-03-01 LAB — HEMOGLOBIN A1C
Estimated Avg Glucose: 91 mg/dL
Hemoglobin A1C: 4.8 % (ref 4.0–5.6)

## 2023-03-01 LAB — PROLACTIN: Prolactin: 7.3 ng/mL

## 2023-03-01 LAB — T4, FREE: T4 Free: 0.8 NG/DL (ref 0.8–1.5)

## 2023-03-01 LAB — TSH: TSH, 3rd Generation: 1.47 u[IU]/mL (ref 0.36–3.74)

## 2023-03-01 NOTE — Telephone Encounter (Signed)
No reason for encounter,

## 2023-03-03 LAB — DHEA-SULFATE: DHEAS (DHEA Sulfate): 236 ug/dL (ref 84.8–378.0)

## 2023-03-06 ENCOUNTER — Ambulatory Visit: Payer: BLUE CROSS/BLUE SHIELD | Attending: Obstetrics & Gynecology

## 2023-03-06 ENCOUNTER — Ambulatory Visit: Payer: BLUE CROSS/BLUE SHIELD

## 2023-03-06 LAB — TESTOSTERONE, FREE, TOTAL
Testosterone, Free: 1.8 pg/mL (ref 0.0–4.2)
Testosterone: 38 ng/dL (ref 13–71)

## 2023-03-06 LAB — 17-OH PROGESTERONE, LCMS: 17 OH Progesterone: 32 ng/dL

## 2023-03-06 NOTE — Telephone Encounter (Signed)
Left a VM for the patient to call back about her appointment tomorrow to confirm it and left the call back number.

## 2023-03-07 ENCOUNTER — Ambulatory Visit: Admit: 2023-03-07 | Payer: BLUE CROSS/BLUE SHIELD | Attending: Obstetrics & Gynecology

## 2023-03-07 ENCOUNTER — Ambulatory Visit: Admit: 2023-03-07 | Payer: BLUE CROSS/BLUE SHIELD

## 2023-03-07 ENCOUNTER — Encounter

## 2023-03-07 ENCOUNTER — Ambulatory Visit: Payer: BLUE CROSS/BLUE SHIELD

## 2023-03-07 ENCOUNTER — Ambulatory Visit: Payer: BLUE CROSS/BLUE SHIELD | Attending: Obstetrics & Gynecology

## 2023-03-07 VITALS — BP 122/78 | Wt 202.0 lb

## 2023-03-07 DIAGNOSIS — N946 Dysmenorrhea, unspecified: Secondary | ICD-10-CM

## 2023-03-07 DIAGNOSIS — R635 Abnormal weight gain: Secondary | ICD-10-CM

## 2023-03-07 LAB — TESTOSTERONE, FREE AND TOTAL, LC/MS/MS
Testosterone, Free: 2 pg/mL (ref 0.0–4.2)
Testosterone: 46.1 ng/dL (ref 10.0–55.0)

## 2023-03-07 NOTE — Progress Notes (Signed)
PROBLEM VISIT    Christy Bailey is a 27 y.o.  presenting for ultrasound and follow up appointment. Her main concerns today include dysmenorrhea and PCOS. Would like to discuss birth control options - slightly interested in IUD.    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     ULTRASOUND TODAY 03/07/2023:  TV ULTRASOUND THE UTERUS IS ANTEVERTED, NORMAL IN SIZE AND ECHOGENICITY. THE ENDOMETRIUM MEASURES 11.1MM IN THICKNESS. NO MASSES OR ABNORMALITIES ARE SEEN. RIGHT OVARY APPEARS WNL. LEFT OVARY APPEARS TO HAVE A COMPLEX CYST MEASURING 34 X 31 X 34 MM. NO FREE FLUID IS SEEN IN THE CDS.      Labs from last ov on 03/01/2023:  FSH 2.7  LH 9.4  A1C 4.8  17 OH PROGESTERONE 32  PROLACTIN 7.3  T4 FREE 0.8  TSH 1.47  TESTOSTERONE, FREE 2.0/1.8  TESTOSTERONE, 38  DHEAS 236.0    Ob/Gyn Hx:  G0  LMP - No LMP recorded. (Menstrual status: Other - See Notes).  Menses - regular, heavy   Contraception - none.  Hx of STI - Yes - chlamydia 3-4x, gonorrhea 2018   SA - Yes      Health Maintenance:  Last Pap: 05/19/2022 NILM    1. Have you been to the ER, urgent care clinic, or hospitalized since your last visit?No    2. Have you seen or consulted any other health care providers outside of the [facility] since your last visit? No    Patient declines chaperone.    Dorothyann Gibbs, RN

## 2023-03-07 NOTE — Progress Notes (Signed)
Problem Visit    Christy Bailey is a 27 y.o. presenting for problem visit. Christy Bailey main concern today is PCOS.  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     ULTRASOUND TODAY 03/07/2023:  TV ULTRASOUND THE UTERUS IS ANTEVERTED, NORMAL IN SIZE AND ECHOGENICITY. THE ENDOMETRIUM MEASURES 11.1MM IN THICKNESS. NO MASSES OR ABNORMALITIES ARE SEEN. RIGHT OVARY APPEARS WNL. LEFT OVARY APPEARS TO HAVE A COMPLEX CYST MEASURING 34 X 31 X 34 MM. NO FREE FLUID IS SEEN IN THE CDS.        Labs from last ov on 03/01/2023:  FSH 2.7  LH 9.4  A1C 4.8  17 OH PROGESTERONE 32  PROLACTIN 7.3  T4 FREE 0.8  TSH 1.47  TESTOSTERONE, FREE 2.0/1.8  TESTOSTERONE, 38  DHEAS 236.0     Ob/Gyn Hx:  G0  LMP - No LMP recorded. (Menstrual status: Other - See Notes).  Menses - regular, heavy   Contraception - none.  Hx of STI - Yes - chlamydia 3-4x, gonorrhea 2018   SA - Yes       Health Maintenance:  Last Pap: 05/19/2022 NILM    Past Medical History:   Diagnosis Date    STI (sexually transmitted infection)     history of gc/c       Past Surgical History:   Procedure Laterality Date    KNEE ARTHROSCOPY Left        History reviewed. No pertinent family history.    Social History     Socioeconomic History    Marital status: Single     Spouse name: Not on file    Number of children: Not on file    Years of education: Not on file    Highest education level: Not on file   Occupational History    Not on file   Tobacco Use    Smoking status: Never    Smokeless tobacco: Never   Vaping Use    Vaping Use: Never used   Substance and Sexual Activity    Alcohol use: Not Currently    Drug use: Never    Sexual activity: Yes     Partners: Male     Birth control/protection: Ring   Other Topics Concern    Not on file   Social History Narrative    Not on file     Social Determinants of Health     Financial Resource Strain: Not on file   Food Insecurity: Not on file   Transportation Needs: Not on file   Physical Activity: Not on  file   Stress: Not on file   Social Connections: Not on file   Intimate Partner Violence: Not on file   Housing Stability: Not on file       Current Outpatient Medications   Medication Sig Dispense Refill    Tretinoin, Facial Wrinkles, 0.02 % CREA Apply topically daily       No current facility-administered medications for this visit.       Allergies   Allergen Reactions    Aspirin Anaphylaxis    Fentanyl Anaphylaxis, Other (See Comments) and Swelling     Had reaction to anesthia  Other reaction(s): Angioedema      Hydromorphone Anaphylaxis and Other (See Comments)     Reaction to anesthsia      Hydromorphone Hcl Anaphylaxis    Other Anaphylaxis and Swelling     Other reaction(s): Hives, Shortness of Breath  Other  reaction(s): Rash  All nuts except hazel nuts and almonds  Other reaction(s): Angioedema    Acetaminophen      Unsure reaction, prefers not to take    Ketorolac     Peanut (Diagnostic) Hives     Cashews, pistachio, walnuts, pecans         Review of Systems - History obtained from the patient, Review of System is negative except as otherwise noted in the HPI      Physical Exam    BP 122/78   Wt 91.6 kg (202 lb)   LMP 02/19/2023     OBGyn Exam    Constitutional  Appearance: well-nourished, well developed, alert, in no acute distress    HENT  Head and Face: appears normal    Chest  Respiratory Effort: non-labored breathing    Neurologic/Psychiatric  Mental Status:  Orientation: grossly oriented to person, place and time  Mood and Affect: mood normal, affect appropriate      Assessment/Plan:  Christy Bailey IS A 27 y.o. G0P0000   1. Weight gain  -discussed weight gain in detail. PCOS labs normal aside from LH/FSH ratio. Normal [place] aside from complex left ovarian cyst.   - [facility] - Ballard Russell, RD, Nutrition Services, [place] ([address])  -has a person Psychologist, educational. Discussed diet   -uncertain regarding BC right now. Given pamphlet on Mirena. Discussed US guidance for insertion if patient decides to have this  placed.     2. Left ovarian cyst  -we discussed the ddx in detail. No pain. Going to follow up in three months to repeat Christy Bailey [place] or sooner if needed         Patient will return to office   Return in about 3 months (around 06/07/2023) for for [place] for complex ovarian cyst .     30+ minutes spent with the patient including face to face interaction and chart review.        Please note that portions of this note was potentially completed with Dragon dictation, the computer voice recognition software.  Quite often unanticipated grammatical, syntax, homophones, and other interpretive errors are inadvertently transcribed by the computer software.  Please disregard these errors.  Please excuse any errors that have escaped final proofreading.  Thank you.        Dejuan Elman L. Lambert Mody, MD

## 2023-03-20 ENCOUNTER — Encounter: Payer: BLUE CROSS/BLUE SHIELD | Attending: Obstetrics & Gynecology

## 2023-03-20 ENCOUNTER — Ambulatory Visit: Payer: BLUE CROSS/BLUE SHIELD
